# Patient Record
Sex: Female | Born: 1970 | Race: White | Hispanic: No | Marital: Single | Smoking: Current every day smoker
Health system: Southern US, Community
[De-identification: ages and names within clinical notes are randomized; demographics above are authoritative.]

## PROBLEM LIST (undated history)

## (undated) DIAGNOSIS — R519 Headache, unspecified: Secondary | ICD-10-CM

## (undated) DIAGNOSIS — K219 Gastro-esophageal reflux disease without esophagitis: Secondary | ICD-10-CM

## (undated) DIAGNOSIS — R51 Headache: Secondary | ICD-10-CM

## (undated) HISTORY — PX: ABDOMINAL HYSTERECTOMY: SHX81

## (undated) HISTORY — PX: CHOLECYSTECTOMY: SHX55

---

## 2014-05-02 ENCOUNTER — Emergency Department: Payer: Self-pay | Admitting: Emergency Medicine

## 2015-06-19 ENCOUNTER — Other Ambulatory Visit: Payer: Self-pay | Admitting: Family Medicine

## 2015-06-19 DIAGNOSIS — Z1231 Encounter for screening mammogram for malignant neoplasm of breast: Secondary | ICD-10-CM

## 2015-07-15 ENCOUNTER — Ambulatory Visit: Payer: Self-pay

## 2015-07-16 ENCOUNTER — Encounter: Payer: Self-pay | Admitting: *Deleted

## 2015-07-17 ENCOUNTER — Encounter
Admission: RE | Disposition: A | Discharge status: Home / Self Care | Payer: Self-pay | Source: Ambulatory Visit | Attending: Gastroenterology

## 2015-07-17 ENCOUNTER — Ambulatory Visit: Payer: Federal, State, Local not specified - PPO | Admitting: Anesthesiology

## 2015-07-17 ENCOUNTER — Ambulatory Visit
Admission: RE | Admit: 2015-07-17 | Discharge: 2015-07-17 | Disposition: A | Discharge status: Home / Self Care | Payer: Federal, State, Local not specified - PPO | Source: Ambulatory Visit | Attending: Gastroenterology | Admitting: Gastroenterology

## 2015-07-17 DIAGNOSIS — F17210 Nicotine dependence, cigarettes, uncomplicated: Secondary | ICD-10-CM | POA: Diagnosis not present

## 2015-07-17 DIAGNOSIS — Z79899 Other long term (current) drug therapy: Secondary | ICD-10-CM | POA: Insufficient documentation

## 2015-07-17 DIAGNOSIS — F419 Anxiety disorder, unspecified: Secondary | ICD-10-CM | POA: Insufficient documentation

## 2015-07-17 DIAGNOSIS — K2270 Barrett's esophagus without dysplasia: Secondary | ICD-10-CM | POA: Insufficient documentation

## 2015-07-17 DIAGNOSIS — R12 Heartburn: Secondary | ICD-10-CM | POA: Diagnosis present

## 2015-07-17 DIAGNOSIS — E669 Obesity, unspecified: Secondary | ICD-10-CM | POA: Insufficient documentation

## 2015-07-17 DIAGNOSIS — Z6833 Body mass index (BMI) 33.0-33.9, adult: Secondary | ICD-10-CM | POA: Diagnosis not present

## 2015-07-17 DIAGNOSIS — G43909 Migraine, unspecified, not intractable, without status migrainosus: Secondary | ICD-10-CM | POA: Insufficient documentation

## 2015-07-17 DIAGNOSIS — K219 Gastro-esophageal reflux disease without esophagitis: Secondary | ICD-10-CM | POA: Diagnosis not present

## 2015-07-17 HISTORY — PX: ESOPHAGOGASTRODUODENOSCOPY (EGD) WITH PROPOFOL: SHX5813

## 2015-07-17 HISTORY — DX: Headache: R51

## 2015-07-17 HISTORY — DX: Gastro-esophageal reflux disease without esophagitis: K21.9

## 2015-07-17 HISTORY — DX: Headache, unspecified: R51.9

## 2015-07-17 SURGERY — ESOPHAGOGASTRODUODENOSCOPY (EGD) WITH PROPOFOL
Anesthesia: General

## 2015-07-17 MED ORDER — SODIUM CHLORIDE 0.9 % IV SOLN: INTRAVENOUS | Status: DC

## 2015-07-17 MED ORDER — LIDOCAINE HCL (CARDIAC) 20 MG/ML IV SOLN
INTRAVENOUS | Status: DC | PRN
  Administered 2015-07-17: 35 mg via INTRAVENOUS

## 2015-07-17 MED ORDER — FENTANYL CITRATE (PF) 100 MCG/2ML IJ SOLN
INTRAMUSCULAR | Status: DC | PRN
  Administered 2015-07-17: 50 ug via INTRAVENOUS

## 2015-07-17 MED ORDER — PROPOFOL 500 MG/50ML IV EMUL
INTRAVENOUS | Status: DC | PRN
  Administered 2015-07-17: 100 ug/kg/min via INTRAVENOUS

## 2015-07-17 MED ORDER — SODIUM CHLORIDE 0.9 % IV SOLN
INTRAVENOUS | Status: DC
  Administered 2015-07-17: 12:00:00 via INTRAVENOUS
  Administered 2015-07-17: 1000 mL via INTRAVENOUS

## 2015-07-17 MED ORDER — PROPOFOL 10 MG/ML IV BOLUS
INTRAVENOUS | Status: DC | PRN
  Administered 2015-07-17: 50 mg via INTRAVENOUS

## 2015-07-17 NOTE — Anesthesia Postprocedure Evaluation (Signed)
  Anesthesia Post-op Note  Patient: Jasmine Curtis  Procedure(s) Performed: Procedure(s): ESOPHAGOGASTRODUODENOSCOPY (EGD) WITH PROPOFOL (N/A)  Anesthesia type:General  Patient location: PACU  Post pain: Pain level controlled  Post assessment: Post-op Vital signs reviewed, Patient's Cardiovascular Status Stable, Respiratory Function Stable, Patent Airway and No signs of Nausea or vomiting  Post vital signs: Reviewed and stable  Last Vitals:  Filed Vitals:   07/17/15 1305  BP: 103/79  Pulse: 73  Temp:   Resp: 16    Level of consciousness: awake, alert  and patient cooperative  Complications: No apparent anesthesia complications

## 2015-07-17 NOTE — Anesthesia Preprocedure Evaluation (Signed)
Anesthesia Evaluation  Patient identified by MRN, date of birth, ID band Patient awake    Reviewed: Allergy & Precautions, H&P , NPO status , Patient's Chart, lab work & pertinent test results  History of Anesthesia Complications Negative for: history of anesthetic complications  Airway Mallampati: II  TM Distance: >3 FB Neck ROM: full    Dental no notable dental hx. (+) Teeth Intact   Pulmonary Current Smoker,    Pulmonary exam normal breath sounds clear to auscultation       Cardiovascular Exercise Tolerance: Good (-) Past MI negative cardio ROS Normal cardiovascular exam Rhythm:regular Rate:Normal     Neuro/Psych  Headaches, PSYCHIATRIC DISORDERS    GI/Hepatic Neg liver ROS, GERD  Controlled and Medicated,  Endo/Other  negative endocrine ROS  Renal/GU negative Renal ROS  negative genitourinary   Musculoskeletal   Abdominal   Peds  Hematology negative hematology ROS (+)   Anesthesia Other Findings Past Medical History:   Headache                                                       Comment:Migraines   GERD (gastroesophageal reflux disease)                       Obesity   Reproductive/Obstetrics negative OB ROS                             Anesthesia Physical Anesthesia Plan  ASA: III  Anesthesia Plan: General   Post-op Pain Management:    Induction:   Airway Management Planned:   Additional Equipment:   Intra-op Plan:   Post-operative Plan:   Informed Consent: I have reviewed the patients History and Physical, chart, labs and discussed the procedure including the risks, benefits and alternatives for the proposed anesthesia with the patient or authorized representative who has indicated his/her understanding and acceptance.   Dental Advisory Given  Plan Discussed with: Anesthesiologist, CRNA and Surgeon  Anesthesia Plan Comments:         Anesthesia Quick  Evaluation

## 2015-07-17 NOTE — Transfer of Care (Signed)
Immediate Anesthesia Transfer of Care Note  Patient: Jasmine Curtis  Procedure(s) Performed: Procedure(s): ESOPHAGOGASTRODUODENOSCOPY (EGD) WITH PROPOFOL (N/A)  Patient Location: PACU and Endoscopy Unit  Anesthesia Type:General  Level of Consciousness: awake  Airway & Oxygen Therapy: Patient Spontanous Breathing  Post-op Assessment: Report given to RN  Post vital signs: stable  Last Vitals:  Filed Vitals:   07/17/15 1045  BP: 97/69  Pulse: 81  Temp: 36.6 C  Resp: 16    Complications: No apparent anesthesia complications

## 2015-07-17 NOTE — H&P (Signed)
  Primary Care Physician:  Jerl Mina, MD  Pre-Procedure History & Physical: HPI:  Jasmine Curtis is a 44 y.o. female is here for an endoscopy.   Past Medical History  Diagnosis Date  . Headache     Migraines  . GERD (gastroesophageal reflux disease)     Past Surgical History  Procedure Laterality Date  . Abdominal hysterectomy    . Cholecystectomy      Prior to Admission medications   Medication Sig Start Date End Date Taking? Authorizing Provider  diazepam (VALIUM) 5 MG tablet Take 5 mg by mouth every 12 (twelve) hours as needed for anxiety.   Yes Historical Provider, MD  HYDROcodone-acetaminophen (NORCO) 7.5-325 MG per tablet Take 1 tablet by mouth every 6 (six) hours as needed for moderate pain.   Yes Historical Provider, MD  lamoTRIgine (LAMICTAL) 100 MG tablet Take 100 mg by mouth 2 (two) times daily.   Yes Historical Provider, MD  loratadine (CLARITIN) 10 MG tablet Take 10 mg by mouth daily.   Yes Historical Provider, MD  omeprazole (PRILOSEC) 20 MG capsule Take 20 mg by mouth daily.   Yes Historical Provider, MD  topiramate (TOPAMAX) 50 MG tablet Take 50 mg by mouth 2 (two) times daily.   Yes Historical Provider, MD    Allergies as of 07/01/2015  . (Not on File)    History reviewed. No pertinent family history.  Social History   Social History  . Marital Status: Single    Spouse Name: N/A  . Number of Children: N/A  . Years of Education: N/A   Occupational History  . Not on file.   Social History Main Topics  . Smoking status: Current Every Day Smoker -- 1.00 packs/day  . Smokeless tobacco: Never Used  . Alcohol Use: No  . Drug Use: No  . Sexual Activity: Not on file   Other Topics Concern  . Not on file   Social History Narrative  . No narrative on file     Physical Exam: BP 97/69 mmHg  Pulse 81  Temp(Src) 97.8 F (36.6 C) (Tympanic)  Resp 16  Ht  (1.575 m)  Wt 82.101 kg (181 lb)  BMI 33.10 kg/m2  SpO2 99% General:   Alert,  pleasant  and cooperative in NAD Head:  Normocephalic and atraumatic. Neck:  Supple; no masses or thyromegaly. Lungs:  Clear throughout to auscultation.    Heart:  Regular rate and rhythm. Abdomen:  Soft, nontender and nondistended. Normal bowel sounds, without guarding, and without rebound.   Neurologic:  Alert and  oriented x4;  grossly normal neurologically.  Impression/Plan: Jasmine Curtis is here for an endoscopy to be performed for chronic GERD  Risks, benefits, limitations, and alternatives regarding  endoscopy have been reviewed with the patient.  Questions have been answered.  All parties agreeable.   Elnita Maxwell, MD  07/17/2015, 12:09 PM

## 2015-07-17 NOTE — Op Note (Signed)
[facility]enterology Patient Name: Jasmine Curtis Procedure Date: 07/17/2015 12:07 PM MRN: [id number] Account #: [id number] Date of Birth: 01-31-1971 Admit Type: Outpatient Age: 44 Room: [facility] ENDO ROOM 1 Gender: Female Note Status: Finalized Procedure:         Upper GI endoscopy Indications:       Heartburn, Suspected esophageal reflux Patient Profile:   This is a 44 year old female. Providers:         Rhona Raider. Shelle Iron, MD Referring MD:      Rhona Leavens. Burnett Sheng, MD (Referring MD) Medicines:         Propofol per Anesthesia Complications:     No immediate complications. Procedure:         Pre-Anesthesia Assessment:                    - Prior to the procedure, a History and Physical was                     performed, and patient medications, allergies and                     sensitivities were reviewed. The patient's tolerance of                     previous anesthesia was reviewed.                    After obtaining informed consent, the endoscope was passed                     under direct vision. Throughout the procedure, the                     patient's blood pressure, pulse, and oxygen saturations                     were monitored continuously. The Endoscope was introduced                     through the mouth, and advanced to the second part of                     duodenum. The upper GI endoscopy was accomplished without                     difficulty. The patient tolerated the procedure well. Findings:      There were esophageal mucosal changes suspicious for short-segment       Barrett's esophagus present at the gastroesophageal junction. The       maximum longitudinal extent of these mucosal changes was 1 cm in length.       C0M1. Mucosa was biopsied with a cold forceps for histology. One       specimen bottle was sent to pathology.      The pylorus and pyloric channel appeared normal but there was some       resistance and mild oozing post scope passage,  suggesting mild stenosis.      The examined duodenum was normal. Impression:        - Esophageal mucosal changes suspicious for short-segment                     Barrett's esophagus.C0M1 [place]. Biopsied.                    -  The pylorus and pyloric channel appeared normal but                     there was some resistance and mild oozing post scope                     passage, suggesting mild stenosis.                    - Normal examined duodenum. Recommendation:    - Observe patient in GI recovery unit.                    - Resume regular diet.                    - Continue present medications.                    - Await pathology results.                    - Continue prilosec.                    - Return to GI clinic.                    - The findings and recommendations were discussed with the                     patient.                    - The findings and recommendations were discussed with the                     patient's family. Procedure Code(s): --- Professional ---                    [phone], Esophagogastroduodenoscopy, flexible, transoral;                     with biopsy, single or multiple CPT copyright 2014 [employer]. All rights reserved. The codes documented in this report are preliminary and upon coder review may  be revised to meet current compliance requirements. Kathalene Frames, MD 07/17/2015 12:32:43 PM This report has been signed electronically. Number of Addenda: 0 Note Initiated On: 07/17/2015 12:07 PM      [facility]

## 2015-07-17 NOTE — Discharge Instructions (Signed)

## 2015-07-19 ENCOUNTER — Encounter: Payer: Self-pay | Admitting: Gastroenterology

## 2015-07-21 LAB — SURGICAL PATHOLOGY

## 2015-11-13 IMAGING — CR DG HAND COMPLETE 3+V*L*
1 series · 3 of 3 positions shown · non-contrast
Comparison: None.

CLINICAL DATA: Recent dog bite

EXAM:
LEFT HAND - COMPLETE 3+ VIEW

[Series 1: pa · 0.17mm/px · 3 of 3 slices shown]
[im 1/3]
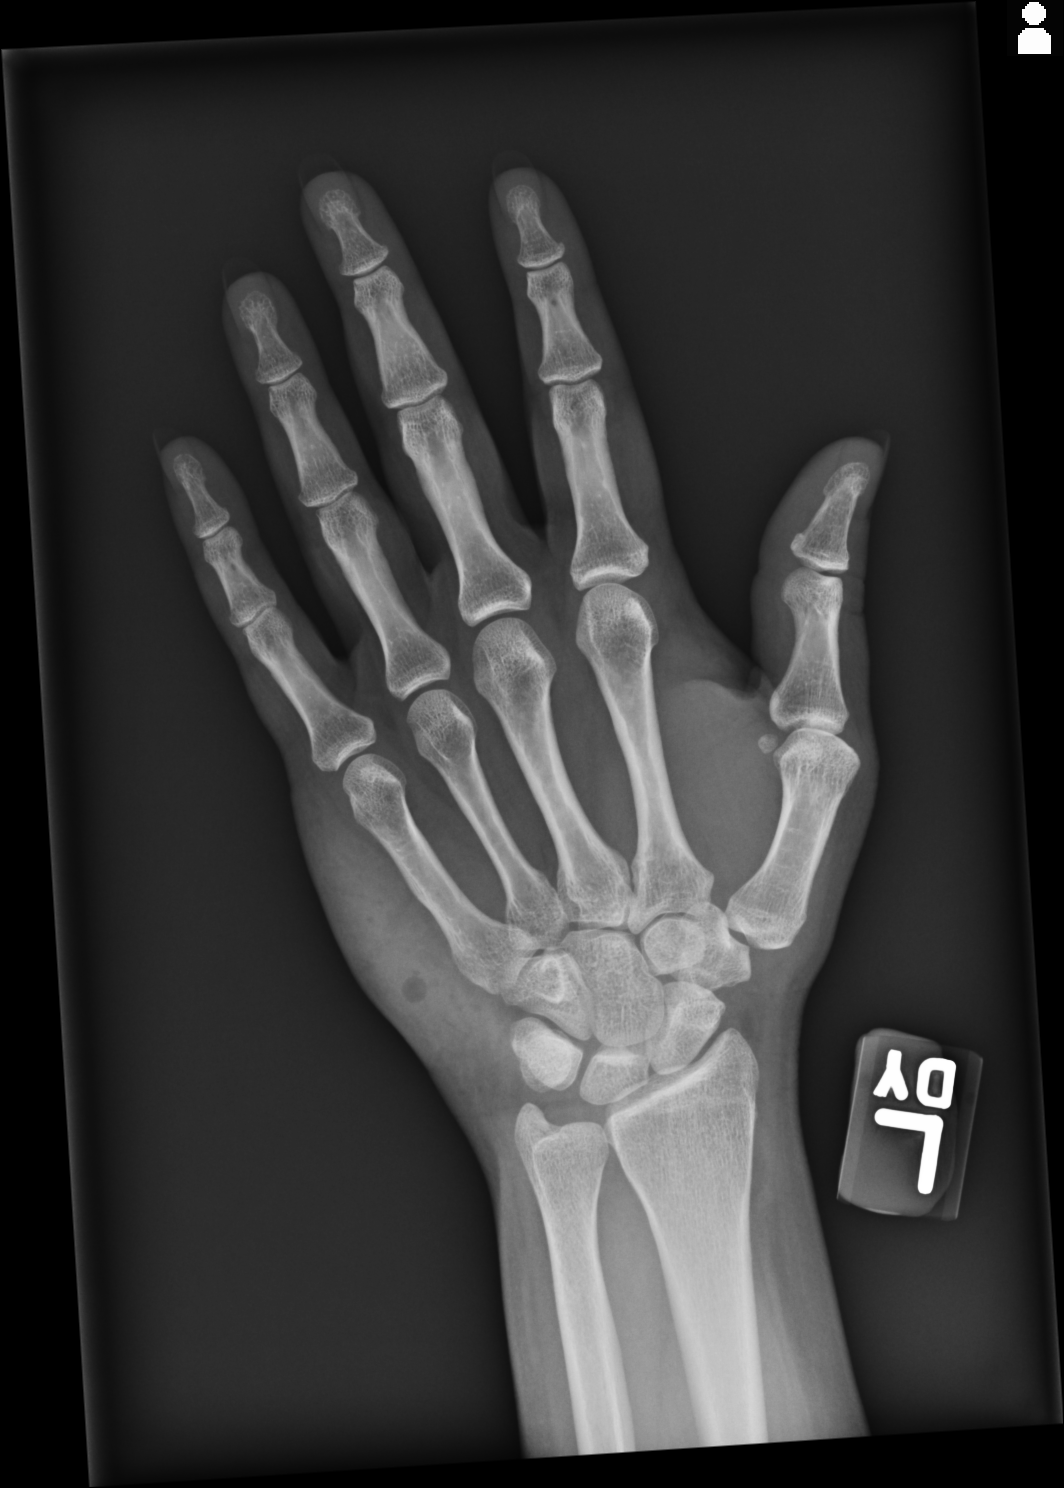
[im 2/3]
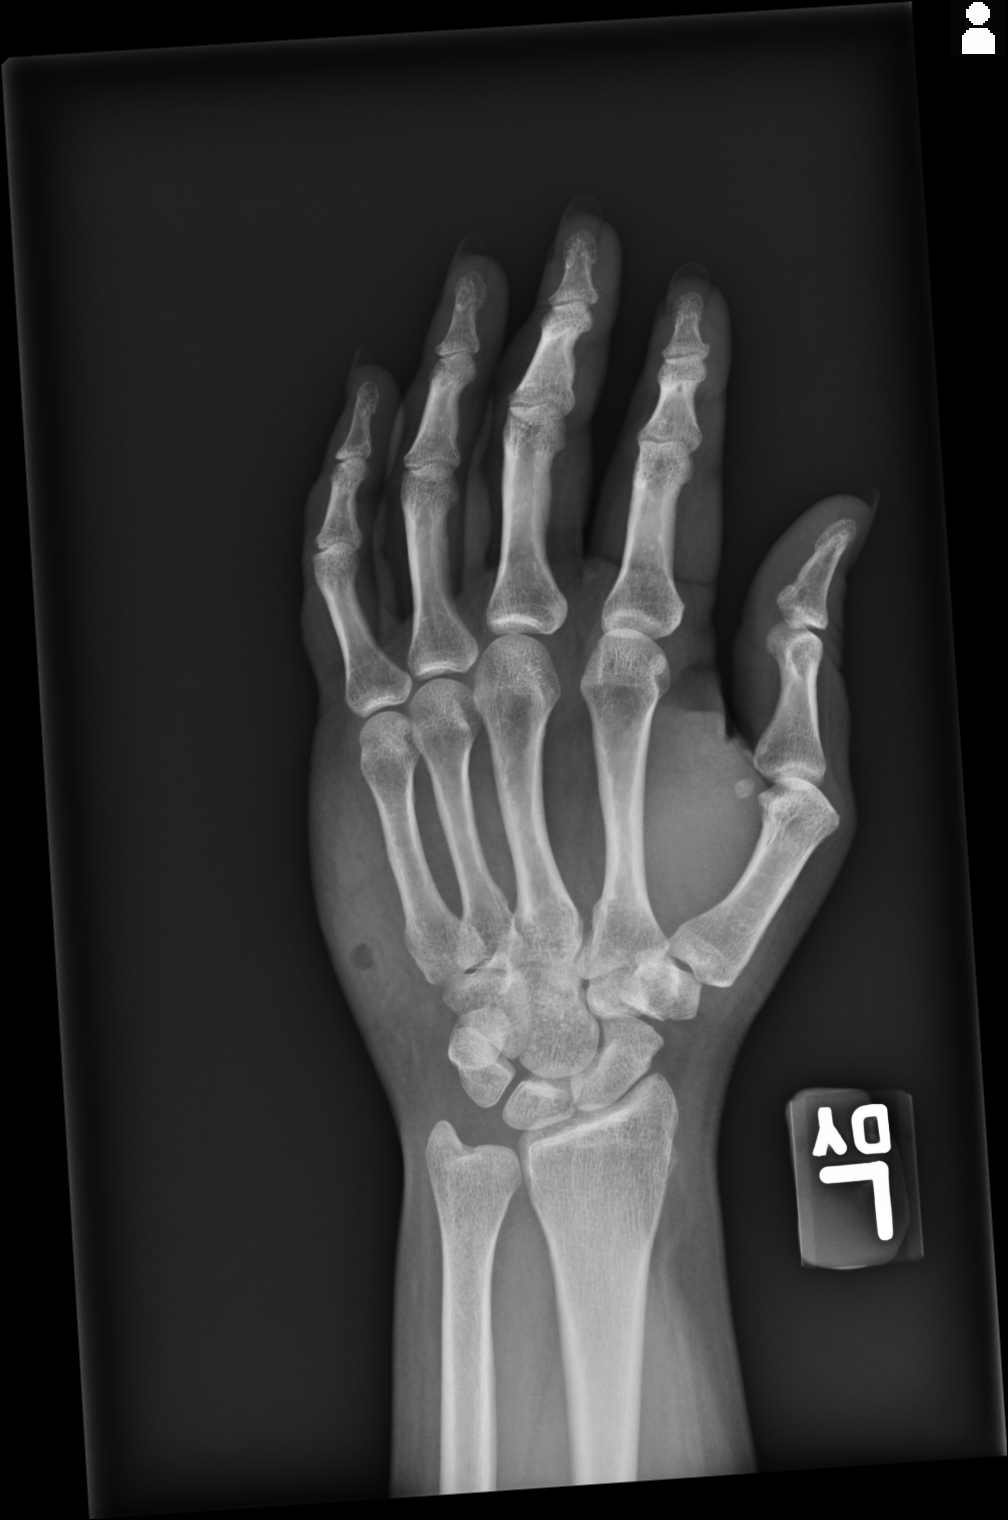
[im 3/3]
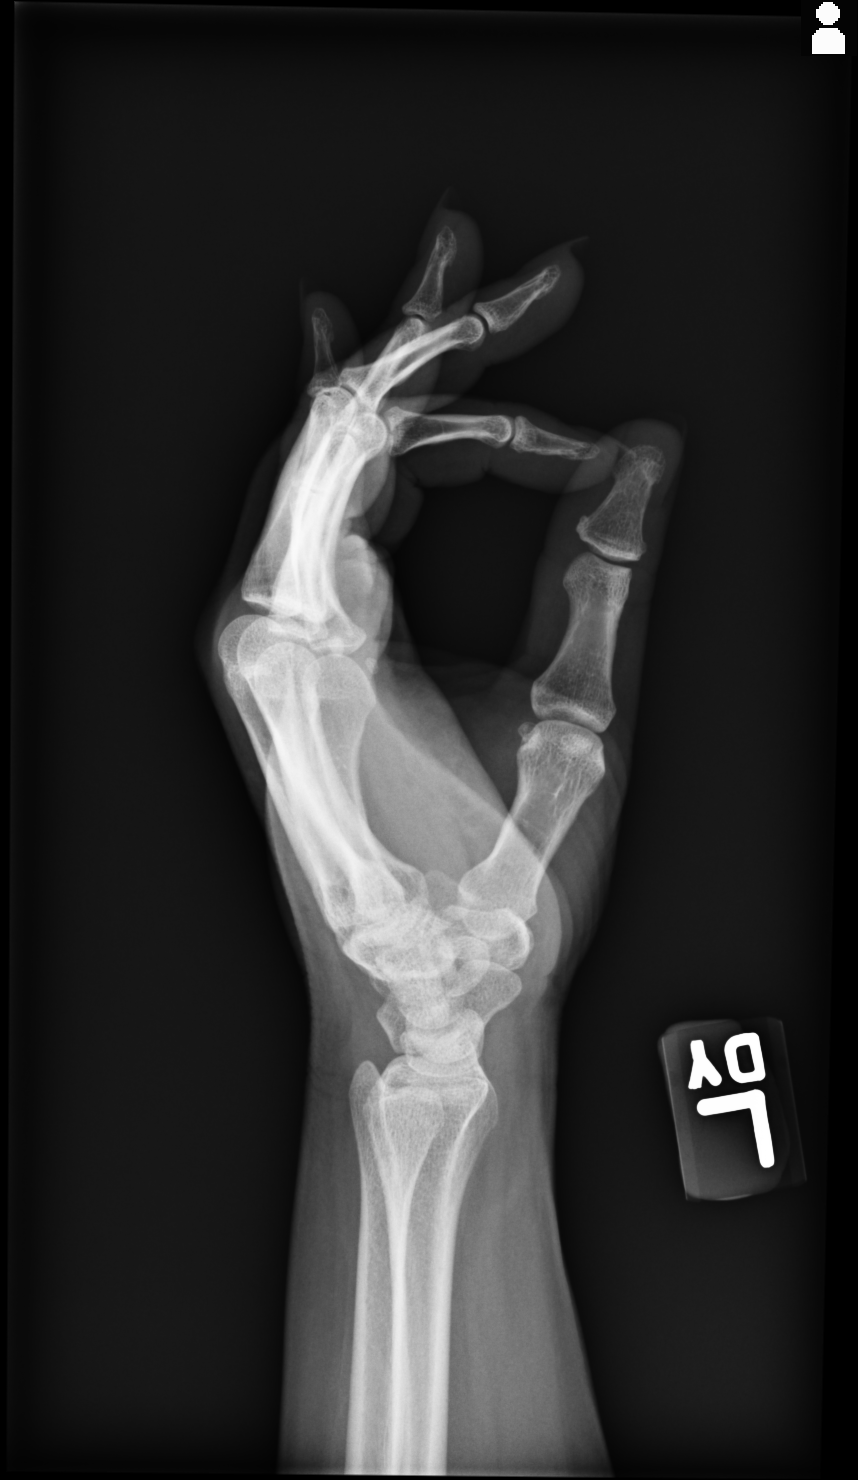

[3 of 3 positions shown; findings below may reference images not displayed]

FINDINGS: No acute fracture is noted. Considerable soft tissue swelling is
noted medially along the course of the fifth metacarpal. A small
focus of air is noted within likely related to the recent injury. No
other focal abnormality is seen.
IMPRESSION: Soft tissue injury without acute bony abnormality.

## 2020-03-03 ENCOUNTER — Other Ambulatory Visit: Payer: Self-pay | Admitting: Family Medicine

## 2020-03-03 DIAGNOSIS — Z1231 Encounter for screening mammogram for malignant neoplasm of breast: Secondary | ICD-10-CM

## 2020-03-11 ENCOUNTER — Inpatient Hospital Stay: Admission: RE | Admit: 2020-03-11 | Payer: Federal, State, Local not specified - PPO | Source: Ambulatory Visit
